# Patient Record
Sex: Male | Born: 1995 | Race: White | Hispanic: No | Marital: Single | Smoking: Never smoker
Health system: Southern US, Community
[De-identification: ages and names within clinical notes are randomized; demographics above are authoritative.]

## PROBLEM LIST (undated history)

## (undated) DIAGNOSIS — F419 Anxiety disorder, unspecified: Secondary | ICD-10-CM

## (undated) HISTORY — DX: Anxiety disorder, unspecified: F41.9

---

## 2017-04-04 ENCOUNTER — Encounter: Payer: Self-pay | Admitting: Physician Assistant

## 2017-04-04 ENCOUNTER — Ambulatory Visit (INDEPENDENT_AMBULATORY_CARE_PROVIDER_SITE_OTHER): Payer: 59

## 2017-04-04 ENCOUNTER — Ambulatory Visit (INDEPENDENT_AMBULATORY_CARE_PROVIDER_SITE_OTHER): Payer: 59 | Admitting: Physician Assistant

## 2017-04-04 VITALS — BP 130/85 | HR 99 | Temp 97.9°F | Resp 18 | Ht 70.71 in | Wt 282.4 lb

## 2017-04-04 DIAGNOSIS — S60121A Contusion of right index finger with damage to nail, initial encounter: Secondary | ICD-10-CM | POA: Diagnosis not present

## 2017-04-04 MED ORDER — MUPIROCIN 2 % EX OINT: 1.0000 "application " | TOPICAL_OINTMENT | Freq: Every day | CUTANEOUS | 1 refills | Status: DC

## 2017-04-04 MED ORDER — MUPIROCIN 2 % EX OINT: 1.0000 "application " | TOPICAL_OINTMENT | Freq: Every day | CUTANEOUS | 1 refills | Status: AC

## 2017-04-04 NOTE — Progress Notes (Signed)
04/04/2017 6:09 PM   DOB: 06-27-1996 / MRN: [id number]  SUBJECTIVE:  Marc Esparza is a 21 y.o. male presenting for subungual hematoma of the right index finger that started four days after slamming the finger in a car door. Complains of exquisite pain about the nail and distal finger. Denies numbness, change in function, weakness of the hand.    He has No Known Allergies.   He  has a past medical history of Anxiety.    He  reports that he has never smoked. He has never used smokeless tobacco. He reports that he drinks about 1.8 oz of alcohol per week . He reports that he does not use drugs. He  reports that he currently engages in sexual activity. The patient  has no past surgical history on file.  His family history includes Diabetes in his maternal grandfather, maternal grandmother, and paternal grandfather.  Review of Systems  Constitutional: Negative for chills and fever.  Skin: Negative for rash.  Neurological: Negative for dizziness.    The problem list and medications were reviewed and updated by myself where necessary and exist elsewhere in the encounter.   OBJECTIVE:  BP 130/85 (BP Location: Right Arm, Patient Position: Sitting, Cuff Size: Large)   Pulse 99   Temp 97.9 F (36.6 C) (Oral)   Resp 18   Ht 5' 10.71" (1.796 m)   Wt 282 lb 6.4 oz (128.1 kg)   SpO2 97%   BMI 39.71 kg/m   Physical Exam  Constitutional: He appears well-developed. He is active and cooperative.  Non-toxic appearance.  Cardiovascular: Normal rate.   Pulmonary/Chest: Effort normal. No tachypnea.  Musculoskeletal:       Hands: Neurological: He is alert.  Skin: Skin is warm and dry. He is not diaphoretic. No pallor.  Vitals reviewed.     No results found for this or any previous visit (from the past 72 hour(s)).  Dg Finger Index Right  Result Date: 04/04/2017 CLINICAL DATA:  Subungual hematoma EXAM: RIGHT INDEX FINGER 2+V COMPARISON:  None. FINDINGS: There is no evidence of fracture  or dislocation. There is no evidence of arthropathy or other focal bone abnormality. Subungual and dorsal soft tissue swelling is noted of the distal right index finger. IMPRESSION: No acute osseous abnormality. Subpleural in dorsal distal soft tissue swelling of the right index finger. Electronically Signed   By: Tollie Ethavid  Kwon M.D.   On: 04/04/2017 17:37    Procedure: Risk and benefits discussed and verbal consent obtained. The patient was anesthetized using 5 cc of 1:1 mix of 1% lidocaine without epi and Marcaine via digital nerve block.  After adequate anesthesia nail lifted away with nail lifter. Wound examined closely revealing no apparent laceration. Xeroform gauze applied to the wound and mupirocin dressing placed.     ASSESSMENT AND PLAN:  [name] was seen today for hand pain.  Diagnoses and all orders for this visit:  Subungual hematoma of right index finger: No fracture. Nail removed. See procedure note.  Advised he return as needed. Mupirocin prescribed.   -     DG Finger Index Right; Future -     Discontinue: mupirocin ointment (BACTROBAN) 2 %; Apply 1 application topically daily. -     mupirocin ointment (BACTROBAN) 2 %; Apply 1 application topically daily.    The patient is advised to call or return to clinic if he does not see an improvement in symptoms, or to seek the care of the closest emergency department if he worsens with the  above plan.   Deliah Boston, MHS, PA-C [facility] 04/04/2017 6:09 PM

## 2017-04-04 NOTE — Patient Instructions (Addendum)
  WOUND CARE Please return if you have concerns. Marland Kitchen. Keep area clean and dry for 24 hours. Do not remove bandage, if applied. . After 24 hours, remove bandage and wash wound gently with mild soap and warm water. Reapply a new bandage after cleaning wound, if directed. . Continue daily cleansing with soap and water until stitches/staples are removed. . Do not apply any ointments or creams to the wound while stitches/staples are in place, as this may cause delayed healing. . Notify the office if you experience any of the following signs of infection: Swelling, redness, pus drainage, streaking, fever >101.0 F . Notify the office if you experience excessive bleeding that does not stop after 15-20 minutes of constant, firm pressure.     IF you received an x-ray today, you will receive an [facility]. Please contact [facility] at [phone] with questions or concerns regarding your invoice.   IF you received labwork today, you will receive an invoice from [facility]. Please contact [facility] at [phone] with questions or concerns regarding your invoice.   Our billing staff will not be able to assist you with questions regarding bills from these companies.  You will be contacted with the lab results as soon as they are available. The fastest way to get your results is to activate your My Chart account. Instructions are located on the last page of this paperwork. If you have not heard from us regarding the results in 2 weeks, please contact this office.

## 2017-12-16 IMAGING — DX DG FINGER INDEX 2+V*R*
3 series · 3 of 3 positions shown · non-contrast
Comparison: None.

CLINICAL DATA: Subungual hematoma

EXAM:
RIGHT INDEX FINGER 2+V

[finger ap]
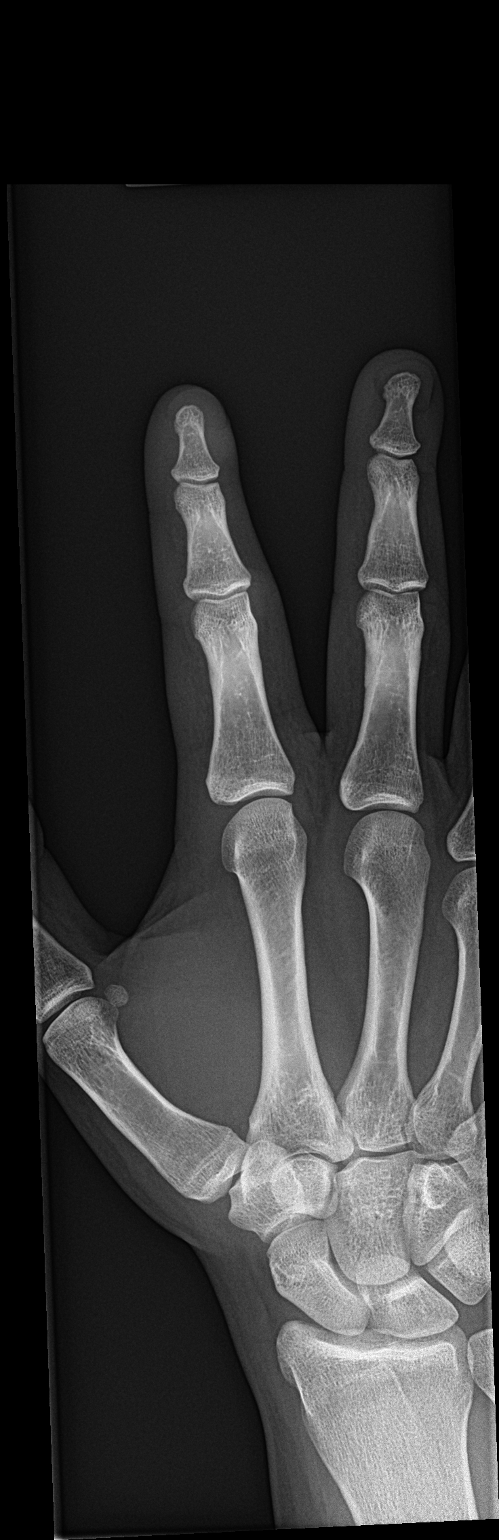

[finger obl]
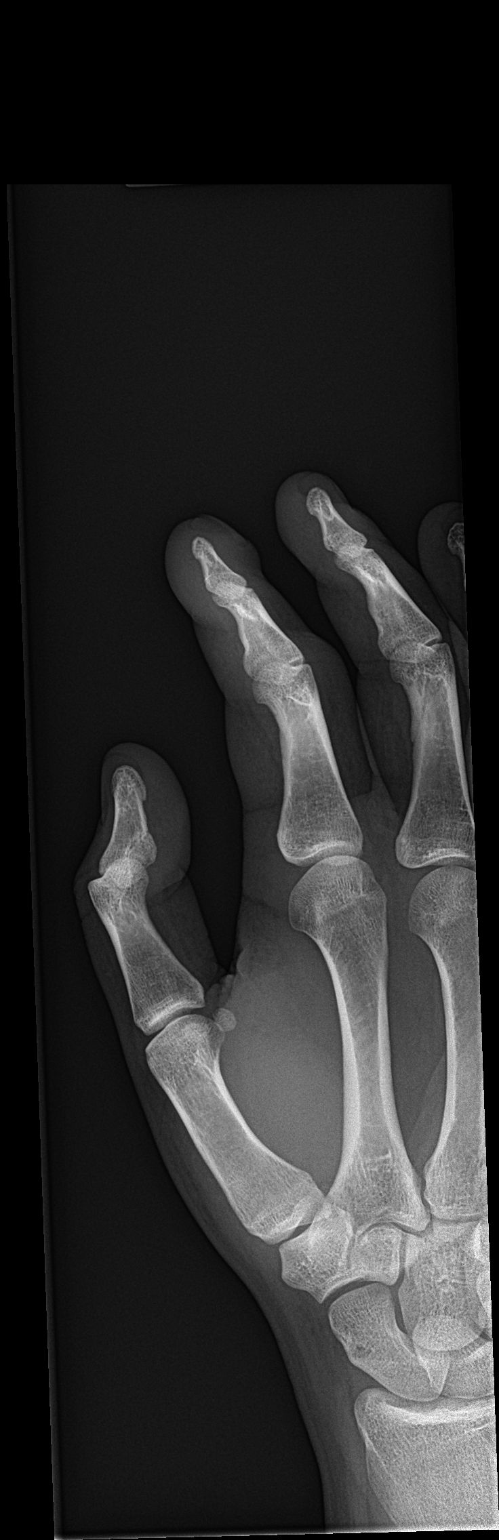

[finger lat]
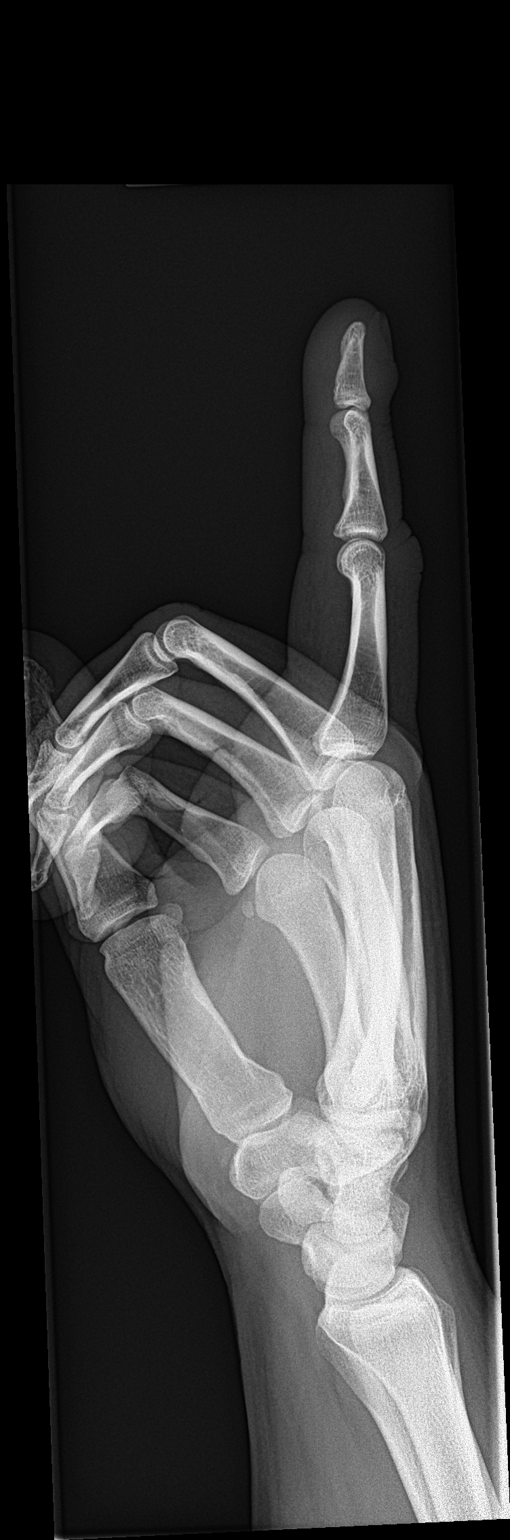

[3 of 3 positions shown; findings below may reference images not displayed]

FINDINGS: There is no evidence of fracture or dislocation. There is no
evidence of arthropathy or other focal bone abnormality. Subungual
and dorsal soft tissue swelling is noted of the distal right index
finger.
IMPRESSION: No acute osseous abnormality. Subpleural in dorsal distal soft
tissue swelling of the right index finger.
